# Patient Record
Sex: Female | Born: 1935 | Race: White | Hispanic: No | Smoking: Former smoker
Health system: Southern US, Community
[De-identification: ages and names within clinical notes are randomized; demographics above are authoritative.]

---

## 1963-03-05 HISTORY — PX: ABDOMINAL HYSTERECTOMY: SHX81

## 1991-03-05 HISTORY — PX: OTHER SURGICAL HISTORY: SHX169

## 2003-05-24 ENCOUNTER — Inpatient Hospital Stay (HOSPITAL_COMMUNITY): Admission: EM | Admit: 2003-05-24 | Discharge: 2003-05-26 | Payer: Self-pay | Admitting: Emergency Medicine

## 2003-06-01 ENCOUNTER — Ambulatory Visit (HOSPITAL_COMMUNITY): Admission: RE | Admit: 2003-06-01 | Discharge: 2003-06-01 | Payer: Self-pay | Admitting: Cardiology

## 2006-03-04 HISTORY — PX: CHOLECYSTECTOMY: SHX55

## 2007-10-16 ENCOUNTER — Inpatient Hospital Stay (HOSPITAL_COMMUNITY): Admission: RE | Admit: 2007-10-16 | Discharge: 2007-10-17 | Payer: Self-pay | Admitting: Neurosurgery

## 2008-09-06 ENCOUNTER — Encounter: Admission: RE | Admit: 2008-09-06 | Discharge: 2008-09-06 | Payer: Self-pay | Admitting: Neurosurgery

## 2010-01-13 IMAGING — CR DG LUMBAR SPINE 2-3V
3 series · 3 of 3 positions shown · non-contrast
Comparison: [HOSPITAL] portable lateral intraoperative
lumbar spine radiographs 10/16/2007 and [HOSPITAL] at [REDACTED] [HOSPITAL] lumbar spine MRI 09/06/2008.

CLINICAL DATA: Increasing low back and left lower extremity pain.

LUMBAR SPINE - 2-3 VIEW

[view not recorded (1 of 3)]
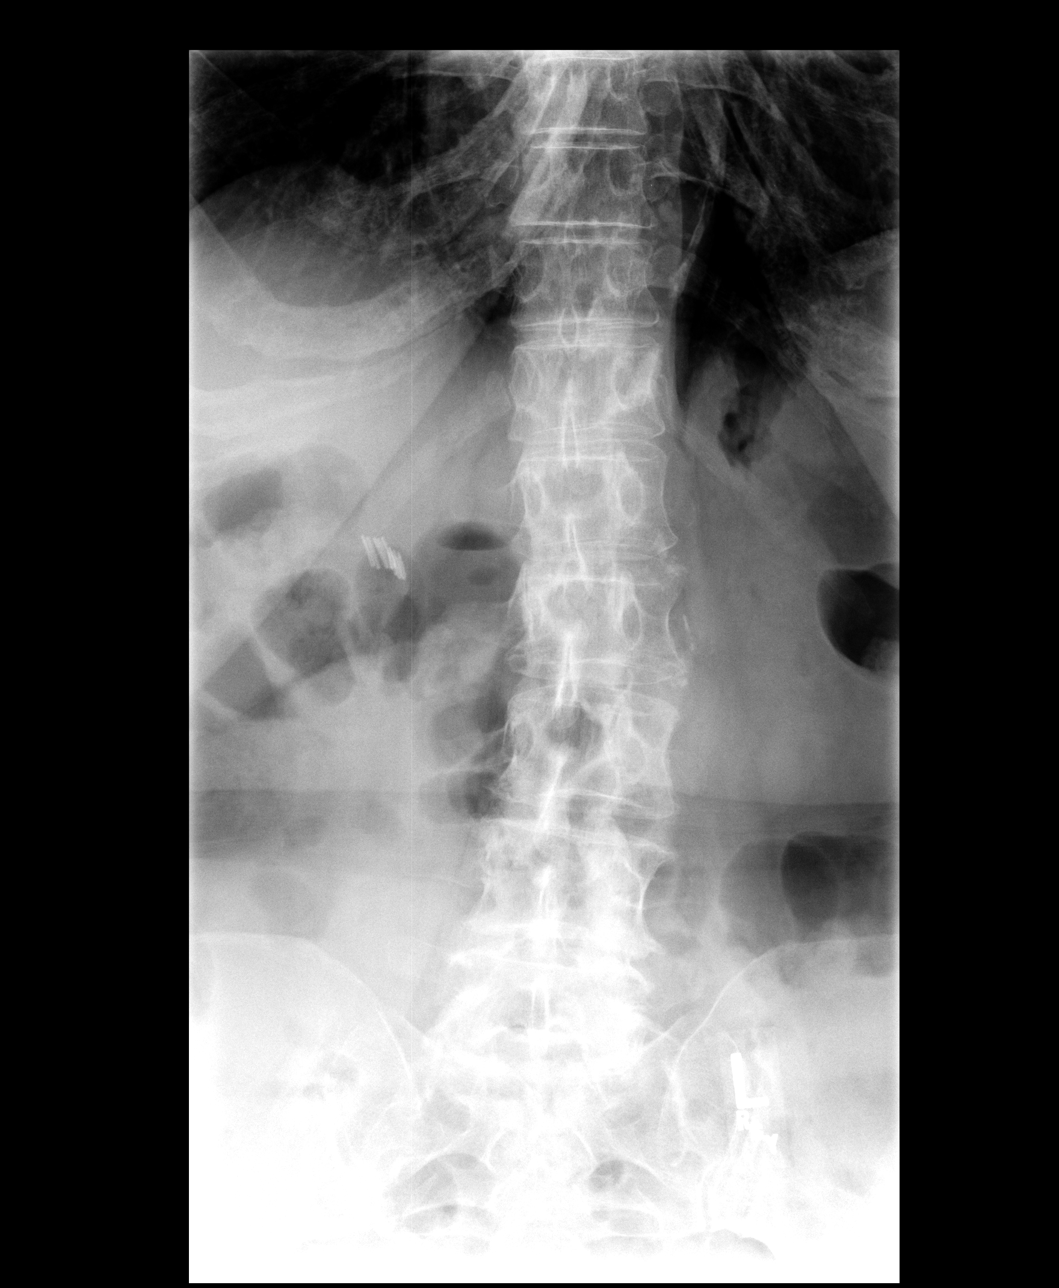

[view not recorded (2 of 3)]
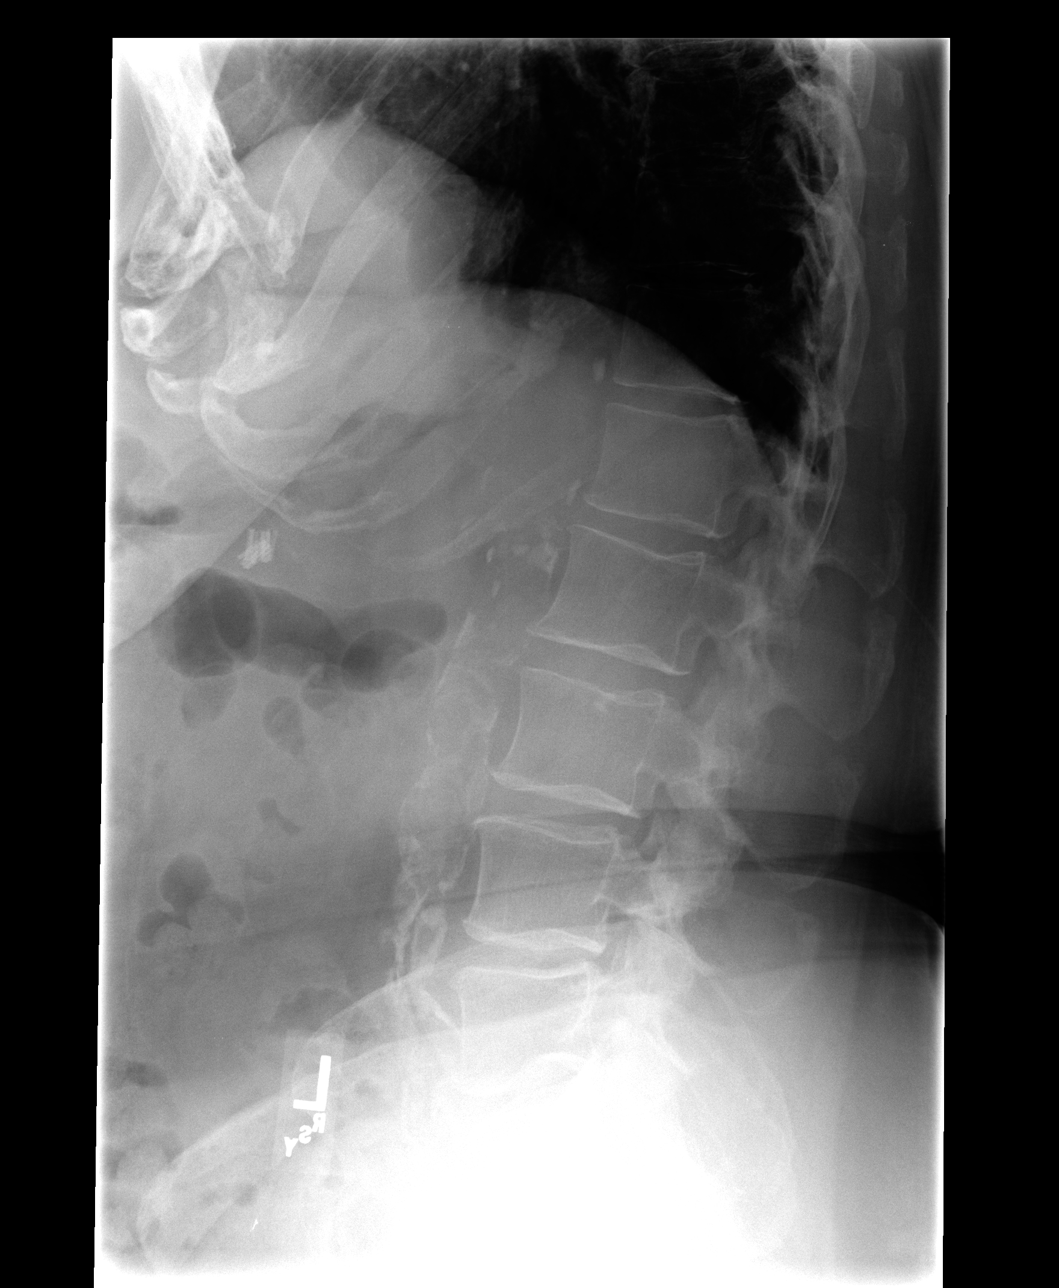

[view not recorded (3 of 3)]
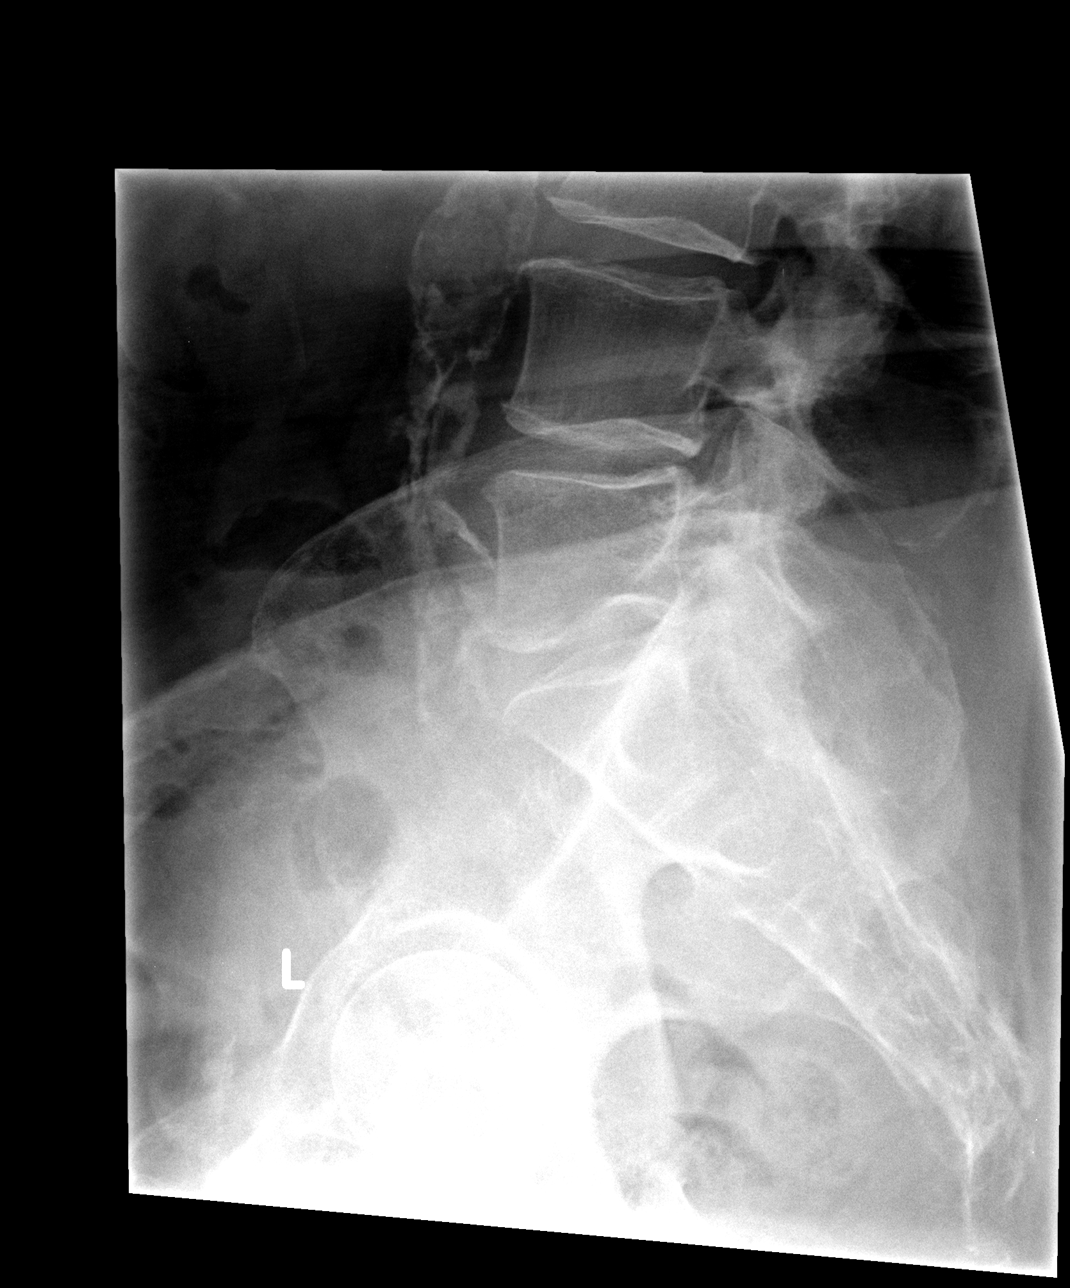

[3 of 3 positions shown; findings below may reference images not displayed]

FINDINGS: Five non-rib bearing lumbar vertebrae are seen with
slight degenerative disc space narrowing L4-5 and L5-S1.  Remaining
disc spaces and vertebral alignment normally maintained.  Slight
levo rotary scoliosis mid lumbar spine visualized.  Moderate to
marked atheromatous vascular calcification with normal caliber
abdominal aorta and postcholecystectomy right upper quadrant
surgical clips seen.  Bilateral sacroiliac joints appear normal
with no other significant osseous, articular or soft tissue
abnormalities seen.
IMPRESSION: 1.  Slight degenerative disc disease L4-5 and L5-S1 with slight
lumbar levo rotary scoliosis.
2.  Vascular calcification.
3.  No acute findings.

## 2010-03-26 ENCOUNTER — Encounter: Payer: Self-pay | Admitting: Neurosurgery

## 2010-07-17 NOTE — H&P (Signed)
NAME:  Kimberly Pearson, MUNFORD NO.:  [id number]   MEDICAL RECORD NO.:  [id number]         PATIENT TYPE:  CINP   LOCATION:                               FACILITY:  [facility]   PHYSICIAN:  Payton Doughty, M.D.      DATE OF BIRTH:  1932/01/14   DATE OF ADMISSION:  10/16/2007  DATE OF DISCHARGE:                              HISTORY & PHYSICAL   ADMISSION DIAGNOSIS:  Spondylosis at L3-L4 and L4-L5.   BODY OF TEXT:  This is a very nice 77 year old right-handed white lady  who has had back and leg pain for a couple years, but it got worse since  last fall.  Pain in her back down her left leg is worse when she is  walking and standing.  Today, it did not bother much.  I saw her with an  MRI.  She has got tight stenosis on the left side at L3-L4 and L4-L5 and  she is going to be admitted for decompression.   MEDICAL HISTORY:  Remarkable for coronary artery disease.  She had a  bypass in 1994.   MEDICATIONS:  Toprol and Diovan.  She takes Crestor.   She has no allergies.   SOCIAL HISTORY:  Does not smoke, drinks on social basis and retired.   FAMILY HISTORY:  Not given.   REVIEW OF SYSTEMS:  Marked for wearing glasses and hypertension.  Her  HEENT exam is within normal limits.  She has reasonable range of motion  of her neck.  Chest is clear.  Cardiac exam is regular rate and rhythm.  Abdomen is nontender with no hepatosplenomegaly.  Extremities are  without clubbing or cyanosis.  GU exam is deferred.  Peripheral pulses  are good.  Neurologically she is awake, alert, and oriented.  Cranial  nerves are intact.  Motor exam shows 5/5 strength throughout the upper  and lower extremities.  Sensory dysesthesia is described in the left L4-  L5 distribution.  Reflexes are 2 at the left knee, 1 at the right, 2 at  the ankles bilaterally.  Straight leg raise is negative.  MRI shows  significant stenosis at L3-L4 and L4-L5, worse at L4-L5 of the left  side.  She has a little bit of epidural  lipomatosis of L3-L4 with  bulging of the disk at L4-L5 of the left.   CLINICAL IMPRESSION:  Mixed L4-L5 radiculopathy secondary to spondylosis  and disk.   PLAN:  For laminotomy and foraminotomy on the left side at L3-L4 and L4-  L5.  We are also going to take look at the disk at L4-L5, and if that  appears it is obstructing the nerve root, we would resect the portion of  it.  The risks and benefits of this approach have been discussed with  her and she wished to proceed.    .           ______________________________  Payton Doughty, M.D.     MWR/MEDQ  D:  10/16/2007  T:  10/17/2007  Job:  [id number]

## 2010-07-17 NOTE — Op Note (Signed)
[name], Kimberly Pearson                  ACCOUNT NO.:  [id number]   MEDICAL RECORD NO.:  [id number]          PATIENT TYPE:  INP   LOCATION:  3108                         FACILITY:  [facility]   PHYSICIAN:  Payton Doughty, M.D.      DATE OF BIRTH:  01/07/1932   DATE OF PROCEDURE:  10/16/2007  DATE OF DISCHARGE:  10/17/2007                               OPERATIVE REPORT   PREOPERATIVE DIAGNOSES:  Spondylosis L3-L4, herniated disk L4-L5.   POSTOPERATIVE DIAGNOSES:  Spondylosis L3-L4, herniated disk L4-L5.   OPERATIVE PROCEDURE:  L3-L4 laminotomy, foraminotomy and L4-L5  laminectomy and diskectomy.   SURGEON:  Payton Doughty, MD   ANESTHESIA:  General endotracheal.   PREPARATION:  Prepped and draped with alcohol wipe.   COMPLICATIONS:  None.   NURSE ASSISTANT:  [place].   DOCTOR ASSISTANCE:  Coletta Memos, MD   BODY OF TEXT:  A 77 year old lady with left L4-L5 radiculopathy is taken  to the operating room, smoothly anesthetized, intubated, and placed  prone on the operating table.  Following shave, prep and drape in the  usual sterile fashion, the skin was infiltrated with 1% lidocaine with  1:400,000 epinephrine.  The skin was incised from the top of L3 to the  bottom of L4, and lamina of L3-L4 are exposed to the left side of the  subperiosteal plane.  Marker was placed and intraoperative x-ray  confirmed the correctness level.  Hemi semi-laminectomy was carried out  first on the left side of L4 to the top of ligamentum flavum that was  removed in a retrograde fashion.  Lateral aspect of the thecal sac and  lateral aspect of the L5 root were identified and the L5 root retracted  medially exposing a herniated disk that was contained under some annular  fibers.  These annular fibers were divided and the disk removed.  The  neural foramen carefully explored as well as thoracic disk and all  graspable fragments were removed.  Attention was then turned to L3-L4  disks where hemilaminectomy was  again carried out to the top of the  ligamentum flavum that was once again removed in retrograde fashion.  The thecal sac thus exposed was carefully explored and soft quadrants  and generous foraminotomy carried out over the L4 root.  Thus the L4 and  L5 roots were decompressed.  Depo-Medrol soaked pad was placed in the  laminectomy defect in successive layers of 0 Vicryl, 2-0 Vicryl, and 3-0  nylon were used to close.  Betadine and Telfa dressing applied and made  occlusive with OpSite.  The patient returned to recovery room in good  condition.          ______________________________  Payton Doughty, M.D.    MWR/MEDQ  D:  10/16/2007  T:  10/17/2007  Job:  [id number]

## 2010-07-20 NOTE — H&P (Signed)
NAME:  Kimberly Pearson, Kimberly Pearson                            ACCOUNT NO.:  [id number]   MEDICAL RECORD NO.:  [id number]                   PATIENT TYPE:  INP   LOCATION:  6529                                 FACILITY:  [facility]   PHYSICIAN:  Veneda Melter, M.D.                   DATE OF BIRTH:  [birth year]   DATE OF ADMISSION:  05/24/2003  DATE OF DISCHARGE:                                HISTORY & PHYSICAL   CHIEF COMPLAINT:  Syncope.   HISTORY:  [name] is a 77 year old white female with a known history of  coronary disease and surgical revascularization who presents with an episode  of syncope at approximately 2 p.m.  The patient was in [facility] with  her husband, who is undergoing peripheral surgical revascularization with  Dr. Arbie Cookey.  She was awakened at 3:30 a.m., and has had nothing to eat or  drink until the afternoon.  She was waiting for her daughter to come to have  lunch with her.  The patient's friends had recently gotten lunch, which was  [employer], and the aroma caused her to feel suddenly cold and clammy.  She  had some churning in her stomach, and then she had frank syncope.  There was  no witnessed seizure activity, no loss of bladder or bowel function, no  postictal symptoms.  She was out for some time, as bystanders contemplated  doing CPR.  Currently, she is in the emergency room and feels well.  She  denies any chest pain or shortness of breath.  No lightheadedness.  She  denies any recent exertional chest discomfort or dyspnea.  She has not had  any lower extremity edema.  No paroxysmal nocturnal dyspnea or orthopnea.  She denies any fevers, chills, or other constitutional symptoms.   PAST MEDICAL HISTORY:  1. Notable for inferior wall myocardial infarction complicated by     cardiogenic shock in January of 1994.  Treated with __________ and     intervention of the right coronary artery.  She had subsequently     reocclusion and underwent coronary artery bypass graft  surgery by Dr.     Tyrone Sage with placement of a vein graft to the right coronary artery, and     to he ramus intermedius.  2. Hypertension.  3. Dyslipidemia.  4. Gastrointestinal bleed with the streptokinase.   ALLERGIES:  None.   MEDICATIONS:  1. Diovan 160 mg per day.  2. Aspirin 325 daily.  3. Lopressor 25 mg b.i.d.  4. Zocor 40 mg q.h.s.   SOCIAL HISTORY:  The patient lives in [place] with her husband.  She has a  history of 2-3 packs per day up until her myocardial infarction.   FAMILY HISTORY:  Mother died at the age of 66, and had her first MI at the  age of 79.  Father died at the age of 20 with history of  coronary disease,  myocardial infarction, and hypertension.  Two sisters and one brother, all  decreased with coronary disease.  One brother underwent bypass surgery.   PHYSICAL EXAMINATION:  GENERAL:  She is an elderly white female in no acute  distress.  VITAL SIGNS:  Afebrile, pulse 82, respirations 20, blood pressure 111/62.  HEENT:  Pupils equal, round and reactive to light.  Extraocular muscles are  intact.  Oropharynx with no lesions.  NECK:  Supple.  No adenopathy, no bruits.  HEART:  Regular rhythm without murmurs.  LUNGS:  Clear to auscultation.  ABDOMEN:  Soft, nontender.  EXTREMITIES:  No edema.  Peripheral pulses are 1+ and equal bilaterally.  NEUROLOGIC:  Motor strength is 5/5.  Sensory is intact to touch.  Cranial  nerves II-XII are intact.   LABORATORY DATA:  ECG revealed normal sinus rhythm at 77, with PVC's, but no  ST-T wave changes.  White count is 9.0, hemoglobin 14.5, hematocrit 42.7,  platelets 244.  Sodium is 136, potassium 4.3, chloride 100, bicarb 32, BUN  28, glucose 96.   ASSESSMENT AND PLAN:  Ms. [name] is a 77 year old white female with acute  coronary disease who presents with syncope.  The patient has not had food or  drink in approximately 12 hours, and has been fairly active.  This may  represent hypoglycemia or dehydration.   Other causes to consider include  pulmonary embolus; however, I doubt this without significant chest  discomfort or shortness of breath.  Possible vasovagal syncope or coronary  disease with ischemia or infarction.  Finally, dysrhythmia.  She has rare  PVC's, but no significant dysrhythmia on telemetry.  Our plan at this point  would be to admit the patient to a telemetry monitor, obtaining serial ECG's  and cardiac enzymes to rule out infarction, and further cardiac work-up when  stable, such as stress imaging study, or cardiac catheterization.  As the  patient is 10 years out from her surgical revascularization, it may be  prudent to proceed directly to angiography.  Further plans will be following  review of her cardiac enzymes.                                                Veneda Melter, M.D.    Melton Alar  D:  05/24/2003  T:  05/25/2003  Job:  [id number]   cc:   Lenard Galloway (?) Dunn(?), M.D.  [place]   Arturo Morton. Riley Kill, M.D. [facility]   Dr. Debroah Baller

## 2010-07-20 NOTE — Discharge Summary (Signed)
NAME:  Kimberly, Pearson                            ACCOUNT NO.:  [id number]   MEDICAL RECORD NO.:  [id number]                   PATIENT TYPE:  INP   LOCATION:  6529                                 FACILITY:  [facility]   PHYSICIAN:  Veneda Melter, M.D.                   DATE OF BIRTH:  [birth year]   DATE OF ADMISSION:  05/24/2003  DATE OF DISCHARGE:  05/26/2003                                 DISCHARGE SUMMARY   PRIMARY CARE PHYSICIAN:  Dr. Karilyn Cota and Dr. Shawnie Pons, at [facility] at [place], [place].   PRIMARY CARDIOLOGIST:  Dr. Sherlyn Lick of [place], [facility]; also Dr. Lewayne Bunting, electrophysiologist at [facility], [place]   DISCHARGE DIAGNOSES:  1. Admitted with syncope in a setting of no food or drink for over 16 hours     and added stress of the husband's surgery that day but she felt a similar     episode prior to having a myocardial infarction in 1994.  2. Elevated D-dimer on admission through the emergency room.  Computed     tomogram of the chest negative for pulmonary embolism.  Lower extremity     ultrasound negative for deep venous thrombosis.  3. Bilateral adrenal nodules found on CT of the chest.  Followup MRI     recommended.  4. Electrophysiology consult after consideration with Dr. Ladona Ridgel who had     seen the patient and looked at the history, thinks that the mechanism of     the syncopal episode is likely neurally mediated, although     supraventricular tachycardia could be a trigger.  He recommends     continuing beta blocker therapy.   SECONDARY DIAGNOSES:  1. Severe three vessel coronary artery disease in the setting of an inferior     myocardial infarction treated with streptokinase.  She is status post     coronary artery bypass graft surgery in 1994.  At that time a saphenous     vein graft to the RCA and a saphenous vein graft to the ramus     intermediate was placed.  2. Hypertension.  3. Dyslipidemia.  4.  Very strong family history of coronary artery disease.  5. History of gastrointestinal bleed when receiving streptokinase for a     myocardial infarction in 03/1992.   PROCEDURES:  1. Computed tomogram of the chest May 25, 2003:  Negative for pulmonary     embolism.  2. A 2-D Echocardiogram May 25, 2003:  Ejection fraction 55-65%.  No     aortic stenosis.  Systolic function normal.  3. On May 25, 2003 lower extremity ultrasound:  Negative for DVT.  4. Adenosine Cardiolite study May 26, 2003:  The study shows no evidence     of ischemia, anteroapical scar, ejection fraction 61%.   DISCHARGE:  Kimberly Pearson is ready for discharge  on May 26, 2003, hospital  day number three.  She has had no evidence of dysrhythmia while on the  monitor here.  She is afebrile.  She had a negative adenosine Cardiolite  study, negative study for pulmonary embolism.  Echo shows an ejection  fraction of 55-65% with no wall motion abnormalities.  The patient has had  no repeat of her syncopal episode.  She will be followed up with an MRI.  She goes home on the following medications:   1. Diovan 160 mg daily.  2. Enteric coated aspirin 325 mg daily.  3. Lopressor 25 mg twice daily.  4. Zocor 40 mg at bedtime daily.   The discharge diet is a low sodium, low cholesterol diet.  She will have an  MRI of the abdomen Wednesday, June 01, 2003.  She will go to admitting at  [facility] at 11:30 in the morning.  She will have an office visit to follow  up with Dr. Riley Kill to discuss the MRI, which will be arranged.   BRIEF HISTORY:  Kimberly Pearson is a 77 year old female.  She has a history  of coronary artery disease discovered in a setting of presentation with  myocardial infarction.  She is status post coronary artery bypass graft  surgery by Dr. Ramon Dredge B. Gerhardt in January 1994.  Subsequent to her  surgery she follows up with her cardiologist, Dr. Sherlyn Lick, every six months.  She has had no subsequent  interventions of any kind.  She had a Cardiolite  test 3-4 years ago which the patient mentions was done just to assess the  current status.  The patient was asymptomatic and it was negative for  ischemia.  Today while waiting for her husband, who was having vascular  surgery, Mrs. Iglesias (who had arisen at 3:30 in the morning and had not eaten  or had anything to drink) was joined by friends who had recently gotten  lunch.  The aroma caused her to feel suddenly clammy.  Her stomach started  churning.  She felt as if she would faint.  She bent over and the next thing  she knew she woke up on the floor.  She had no ictal symptoms, no tonic or  clonic motions.  According to the witnesses if she had not come to soon, CPR  would have been started.  She was transferred to the emergency room for  assessment and intervention.   HOSPITAL COURSE:  After admission through the emergency room at [facility]  the patient did not exhibit any arrhythmias on the monitor for the three  days that she was here.  She had a fairly thorough workup.  She was ruled  out for myocardial infarction with a negative troponin I study x3.  A  computed tomogram of the chest was negative for PE.  Lower extremity  ultrasound was negative for DVT.  She had an echocardiogram as dictated  above and an adenosine Cardiolite which was negative for ischemia, although  it did show the presence of anteroapical scarring.  The patient was seen  also in consultation by Dr. Doylene Canning. Ladona Ridgel, who felt that the mechanism of  the syncope was likely a neurally mediated mechanism and advised her to  continue her beta blocker, which she will.  In addition, the computed  tomogram picked up bilateral adrenal nodules, most likely adenomas, but she  will have a followup MRI.   LABORATORY DATA:  Studies here, complete blood count - white cells  9, 14.5  for hemoglobin, for hematocrit 42.7, platelets 244.  Serum electrolytes - sodium 136, potassium  4.3, chloride 100, bicarbonate 32.1.  BUN is 28.  Glucose is 96.  Liver profile - cholesterol 129, triglycerides 61, HDL  cholesterol 48, LDL cholesterol 69.  Creatinine this admission was 1.1.  Troponin I study x3 at 0.01.      Maple Mirza, P.A.                    Veneda Melter, M.D.    GM/MEDQ  D:  05/26/2003  T:  05/29/2003  Job:  [id number]   cc:   Arturo Morton. Riley Kill, M.D. [facility]   Malkiat Dhatt, M.D.  [address]  [place]  [place] [address]  Fax: [phone]   Lionel December, M.D.  [address]  [place]  [place] [address]  Fax: [phone]

## 2010-07-20 NOTE — Consult Note (Signed)
NAME:  Kimberly Pearson, Kimberly Pearson                            ACCOUNT NO.:  [id number]   MEDICAL RECORD NO.:  [id number]                   PATIENT TYPE:  INP   LOCATION:  6529                                 FACILITY:  [facility]   PHYSICIAN:  Doylene Canning. Ladona Ridgel, M.D.               DATE OF BIRTH:  [birth year]   DATE OF CONSULTATION:  05/26/2003  DATE OF DISCHARGE:  05/26/2003                                   CONSULTATION   INDICATION FOR CONSULTATION:  Evaluation of syncope.   HISTORY OF PRESENT ILLNESS:  The patient is a 77 year old woman with known  coronary disease, status post diaphragmatic MI back in 1994.  She is status  post bypass surgery at that time.  She was recently in the waiting area of  the hospital, as her husband was having vascular surgery, when she developed  the sudden onset of nausea and diaphoresis, followed by dizziness.  She was  in the sitting position at that time and bent over.  The next thing she  remembers, she was awake with people around her, and she felt somewhat  weakened and diaphoretic.  She was admitted for additional evaluation.  The  patient states that in the last 30 years she has had 3-4 of these episodes.  They typically occur without particular warning, are associated with a  prodrome of nausea and diaphoresis, and result in her passing out and  feeling tired and weak after the episode.  She denies associated  palpitations.  She denies chest pain or shortness of breath.  The patient is  usually followed by Dr. Lenard Galloway __________.   PAST MEDICAL HISTORY:  As previously noted.  She has a history of  hypertension, dyslipidemia, and a history of GI bleeding when she was  treated for her MI with streptokinase.  She has a history of hysterectomy,  tonsillectomy, and is status post bypass surgery in 1994.  Her recently echo  demonstrated preserved LV systolic function.   SOCIAL HISTORY:  Notable for the patient being married.  She has a history  of tobacco abuse,  stopping smoking 10 years ago.  She denies alcohol abuse.   FAMILY HISTORY:  Notable for her mother dying at age 101 of an MI, and father  dying at age 16 of complications of coronary disease.   REVIEW OF SYSTEMS:  Notable for no fevers, chills, night sweats.  She denies  headache or vision problems.  She denies hearing problems or photophobia.  She denies skin rashes or lesions.  She denies chest pain or shortness of  breath, dyspnea, PND, or orthopnea.  She denies claudication.  She has frank  syncope, as previously noted.  She denies palpitations.  She denies  arthritic complaint.  She denies dysuria, hematuria, nocturia.  She denies  weakness, numbness, or depression.  She denies nausea, vomiting, diarrhea,  or constipation.  She denies polyuria or polydipsia.  PHYSICAL EXAMINATION:  GENERAL:  She is a pleasant, middle-aged, obese woman  in no distress.  VITAL SIGNS:  Blood pressure was 120/74, pulse 88 and regular, respirations  18.  HEENT:  Normocephalic and atraumatic.  Pupils equal and round.  Oropharynx  was moist.  Sclerae were anicteric.  NECK:  No jugular venous distention.  There was no thyromegaly.  The trachea  was midline.  CARDIOVASCULAR:  Regular rate and rhythm with normal S1 and S2.  There were  no murmurs, rubs, or gallops.  LUNGS:  Clear bilaterally to auscultation with no wheezes, rales, or  rhonchi.  ABDOMEN:  Soft, nontender, nondistended.  There was no organomegaly.  EXTREMITIES:  No clubbing, cyanosis, or edema.  Pulses were 2+ and  symmetric.   EKG demonstrates normal sinus rhythm with normal axis __________.  There  were occasional PVC's noted.  Telemetry demonstrates intermittent episodes  of a narrow QRS tachycardia, which is a long RP tachycardia at rates of 150  beats per minute.  This is consistent with an atrial tachycardia.   IMPRESSION:  1. Recurrent syncope.  2. Coronary artery disease status post myocardial infarction, status post      bypass surgery with preserved left ventricular systolic function by     echocardiogram.  3. Supraventricular tachycardia, which appears to be asymptomatic.  It     appears to be due to atrial tachycardia.  4. Hypertension.  5. Dyslipidemia.   DISCUSSION:  The patient's syncope is notable that she has not eaten all day  prior to the episode.  She became nauseated and diaphoretic and was  unresponsive for several minutes.  She has normal left ventricular function.  The patient has had several episodes of these in the past, but they were  always separated by a long period prior to the syncope.  She denies any  associated palpitations.  I agree with Cardiolite stress test.  I think the  mechanism of the syncope is likely neurally mediated in conjunction with  some orthostasis.  Her SVT could be a trigger, despite it being otherwise  asymptomatic.  For now, I would recommend continuation of beta blockade.  If  she continues to have recurrent episodes of syncope, then additional  evaluation would be warranted.                                               Doylene Canning. Ladona Ridgel, M.D.    GWT/MEDQ  D:  05/26/2003  T:  05/27/2003  Job:  [id number]   cc:   Leida Lauth(?), M.D.   Dr. Raymond Gurney

## 2010-11-30 LAB — BASIC METABOLIC PANEL
BUN: 17
Chloride: 91 — ABNORMAL LOW
Creatinine, Ser: 1.08
GFR calc non Af Amer: 50 — ABNORMAL LOW
Glucose, Bld: 102 — ABNORMAL HIGH
Potassium: 3.9

## 2010-11-30 LAB — URINALYSIS, ROUTINE W REFLEX MICROSCOPIC
Bilirubin Urine: NEGATIVE
Glucose, UA: NEGATIVE
Nitrite: NEGATIVE
Protein, ur: 30 — AB
Specific Gravity, Urine: 1.014
pH: 6

## 2010-11-30 LAB — COMPREHENSIVE METABOLIC PANEL
ALT: 26
Alkaline Phosphatase: 48
BUN: 17
Calcium: 9.6
Creatinine, Ser: 1.05
GFR calc non Af Amer: 51 — ABNORMAL LOW
Potassium: 3.9
Total Bilirubin: 0.7
Total Protein: 7.7

## 2010-11-30 LAB — CBC
Hemoglobin: 14.3
MCHC: 34
RBC: 4.06
RDW: 14.6

## 2010-11-30 LAB — DIFFERENTIAL
Eosinophils Absolute: 0.1
Lymphocytes Relative: 26
Lymphs Abs: 1.7

## 2010-11-30 LAB — URINE MICROSCOPIC-ADD ON

## 2010-11-30 LAB — POCT I-STAT 4, (NA,K, GLUC, HGB,HCT)
Glucose, Bld: 141 — ABNORMAL HIGH
Potassium: 3.9
Sodium: 130 — ABNORMAL LOW

## 2010-11-30 LAB — APTT: aPTT: 28

## 2015-01-17 DIAGNOSIS — I4510 Unspecified right bundle-branch block: Secondary | ICD-10-CM

## 2015-01-17 DIAGNOSIS — I2510 Atherosclerotic heart disease of native coronary artery without angina pectoris: Secondary | ICD-10-CM | POA: Insufficient documentation

## 2015-01-17 DIAGNOSIS — I10 Essential (primary) hypertension: Secondary | ICD-10-CM | POA: Insufficient documentation

## 2015-01-17 HISTORY — DX: Essential (primary) hypertension: I10

## 2015-01-17 HISTORY — DX: Atherosclerotic heart disease of native coronary artery without angina pectoris: I25.10

## 2015-01-17 HISTORY — DX: Unspecified right bundle-branch block: I45.10

## 2015-01-18 DIAGNOSIS — I10 Essential (primary) hypertension: Secondary | ICD-10-CM | POA: Diagnosis not present

## 2015-01-18 DIAGNOSIS — I25119 Atherosclerotic heart disease of native coronary artery with unspecified angina pectoris: Secondary | ICD-10-CM | POA: Diagnosis not present

## 2015-01-18 DIAGNOSIS — E785 Hyperlipidemia, unspecified: Secondary | ICD-10-CM | POA: Diagnosis not present

## 2015-01-18 DIAGNOSIS — I4510 Unspecified right bundle-branch block: Secondary | ICD-10-CM | POA: Diagnosis not present

## 2015-07-05 DIAGNOSIS — M545 Low back pain: Secondary | ICD-10-CM | POA: Diagnosis not present

## 2015-07-05 DIAGNOSIS — R110 Nausea: Secondary | ICD-10-CM | POA: Diagnosis not present

## 2015-07-05 DIAGNOSIS — J309 Allergic rhinitis, unspecified: Secondary | ICD-10-CM | POA: Diagnosis not present

## 2015-07-05 DIAGNOSIS — G4700 Insomnia, unspecified: Secondary | ICD-10-CM | POA: Diagnosis not present

## 2015-08-09 DIAGNOSIS — I10 Essential (primary) hypertension: Secondary | ICD-10-CM | POA: Diagnosis not present

## 2015-08-11 DIAGNOSIS — I10 Essential (primary) hypertension: Secondary | ICD-10-CM | POA: Diagnosis not present

## 2015-08-23 DIAGNOSIS — I10 Essential (primary) hypertension: Secondary | ICD-10-CM | POA: Diagnosis not present

## 2015-10-16 DIAGNOSIS — H31002 Unspecified chorioretinal scars, left eye: Secondary | ICD-10-CM | POA: Diagnosis not present

## 2015-10-16 DIAGNOSIS — H04123 Dry eye syndrome of bilateral lacrimal glands: Secondary | ICD-10-CM | POA: Diagnosis not present

## 2015-10-16 DIAGNOSIS — H524 Presbyopia: Secondary | ICD-10-CM | POA: Diagnosis not present

## 2016-02-22 DIAGNOSIS — E785 Hyperlipidemia, unspecified: Secondary | ICD-10-CM | POA: Diagnosis not present

## 2016-02-22 DIAGNOSIS — I2510 Atherosclerotic heart disease of native coronary artery without angina pectoris: Secondary | ICD-10-CM | POA: Diagnosis not present

## 2016-02-22 DIAGNOSIS — G4700 Insomnia, unspecified: Secondary | ICD-10-CM | POA: Diagnosis not present

## 2016-02-22 DIAGNOSIS — I10 Essential (primary) hypertension: Secondary | ICD-10-CM | POA: Diagnosis not present

## 2016-02-22 DIAGNOSIS — Z23 Encounter for immunization: Secondary | ICD-10-CM | POA: Diagnosis not present

## 2016-04-19 DIAGNOSIS — E785 Hyperlipidemia, unspecified: Secondary | ICD-10-CM | POA: Diagnosis not present

## 2016-04-19 DIAGNOSIS — G4700 Insomnia, unspecified: Secondary | ICD-10-CM | POA: Diagnosis not present

## 2016-04-19 DIAGNOSIS — I10 Essential (primary) hypertension: Secondary | ICD-10-CM | POA: Diagnosis not present

## 2016-04-23 DIAGNOSIS — I10 Essential (primary) hypertension: Secondary | ICD-10-CM | POA: Diagnosis not present

## 2016-04-23 DIAGNOSIS — E785 Hyperlipidemia, unspecified: Secondary | ICD-10-CM | POA: Diagnosis not present

## 2016-05-30 DIAGNOSIS — I10 Essential (primary) hypertension: Secondary | ICD-10-CM | POA: Diagnosis not present

## 2016-05-30 DIAGNOSIS — I4510 Unspecified right bundle-branch block: Secondary | ICD-10-CM | POA: Diagnosis not present

## 2016-05-30 DIAGNOSIS — I25119 Atherosclerotic heart disease of native coronary artery with unspecified angina pectoris: Secondary | ICD-10-CM | POA: Diagnosis not present

## 2016-05-30 DIAGNOSIS — E785 Hyperlipidemia, unspecified: Secondary | ICD-10-CM | POA: Diagnosis not present

## 2016-11-09 DIAGNOSIS — I959 Hypotension, unspecified: Secondary | ICD-10-CM | POA: Diagnosis not present

## 2016-11-09 DIAGNOSIS — I160 Hypertensive urgency: Secondary | ICD-10-CM | POA: Diagnosis not present

## 2016-11-09 DIAGNOSIS — R0602 Shortness of breath: Secondary | ICD-10-CM | POA: Diagnosis not present

## 2016-11-09 DIAGNOSIS — J189 Pneumonia, unspecified organism: Secondary | ICD-10-CM | POA: Diagnosis not present

## 2016-11-09 DIAGNOSIS — R918 Other nonspecific abnormal finding of lung field: Secondary | ICD-10-CM | POA: Diagnosis not present

## 2016-11-09 DIAGNOSIS — I2510 Atherosclerotic heart disease of native coronary artery without angina pectoris: Secondary | ICD-10-CM | POA: Diagnosis not present

## 2016-11-10 DIAGNOSIS — E7800 Pure hypercholesterolemia, unspecified: Secondary | ICD-10-CM | POA: Diagnosis not present

## 2016-11-10 DIAGNOSIS — K219 Gastro-esophageal reflux disease without esophagitis: Secondary | ICD-10-CM | POA: Diagnosis not present

## 2016-11-10 DIAGNOSIS — I25810 Atherosclerosis of coronary artery bypass graft(s) without angina pectoris: Secondary | ICD-10-CM | POA: Diagnosis not present

## 2016-11-10 DIAGNOSIS — Z7982 Long term (current) use of aspirin: Secondary | ICD-10-CM | POA: Diagnosis not present

## 2016-11-10 DIAGNOSIS — J189 Pneumonia, unspecified organism: Secondary | ICD-10-CM | POA: Diagnosis not present

## 2016-11-10 DIAGNOSIS — Z951 Presence of aortocoronary bypass graft: Secondary | ICD-10-CM | POA: Diagnosis not present

## 2016-11-10 DIAGNOSIS — I10 Essential (primary) hypertension: Secondary | ICD-10-CM | POA: Diagnosis not present

## 2016-11-10 DIAGNOSIS — I950 Idiopathic hypotension: Secondary | ICD-10-CM | POA: Diagnosis not present

## 2016-11-10 DIAGNOSIS — R0602 Shortness of breath: Secondary | ICD-10-CM | POA: Diagnosis not present

## 2016-11-10 DIAGNOSIS — I160 Hypertensive urgency: Secondary | ICD-10-CM | POA: Diagnosis not present

## 2016-11-10 DIAGNOSIS — R918 Other nonspecific abnormal finding of lung field: Secondary | ICD-10-CM | POA: Diagnosis not present

## 2016-11-10 DIAGNOSIS — Z79899 Other long term (current) drug therapy: Secondary | ICD-10-CM | POA: Diagnosis not present

## 2016-11-10 DIAGNOSIS — I2510 Atherosclerotic heart disease of native coronary artery without angina pectoris: Secondary | ICD-10-CM | POA: Diagnosis not present

## 2016-11-10 DIAGNOSIS — Z87891 Personal history of nicotine dependence: Secondary | ICD-10-CM | POA: Diagnosis not present

## 2016-11-10 DIAGNOSIS — I959 Hypotension, unspecified: Secondary | ICD-10-CM | POA: Diagnosis not present

## 2016-11-10 DIAGNOSIS — Z23 Encounter for immunization: Secondary | ICD-10-CM | POA: Diagnosis not present

## 2016-11-11 DIAGNOSIS — Z23 Encounter for immunization: Secondary | ICD-10-CM | POA: Diagnosis not present

## 2016-11-11 DIAGNOSIS — E7800 Pure hypercholesterolemia, unspecified: Secondary | ICD-10-CM | POA: Diagnosis not present

## 2016-11-11 DIAGNOSIS — I10 Essential (primary) hypertension: Secondary | ICD-10-CM | POA: Diagnosis not present

## 2016-11-11 DIAGNOSIS — I25810 Atherosclerosis of coronary artery bypass graft(s) without angina pectoris: Secondary | ICD-10-CM | POA: Diagnosis not present

## 2016-11-11 DIAGNOSIS — Z7982 Long term (current) use of aspirin: Secondary | ICD-10-CM | POA: Diagnosis not present

## 2016-11-11 DIAGNOSIS — I160 Hypertensive urgency: Secondary | ICD-10-CM | POA: Diagnosis not present

## 2016-11-11 DIAGNOSIS — J189 Pneumonia, unspecified organism: Secondary | ICD-10-CM | POA: Diagnosis not present

## 2016-11-11 DIAGNOSIS — I950 Idiopathic hypotension: Secondary | ICD-10-CM | POA: Diagnosis not present

## 2016-11-11 DIAGNOSIS — Z87891 Personal history of nicotine dependence: Secondary | ICD-10-CM | POA: Diagnosis not present

## 2016-11-11 DIAGNOSIS — R918 Other nonspecific abnormal finding of lung field: Secondary | ICD-10-CM | POA: Diagnosis not present

## 2016-11-11 DIAGNOSIS — Z951 Presence of aortocoronary bypass graft: Secondary | ICD-10-CM | POA: Diagnosis not present

## 2016-11-11 DIAGNOSIS — K219 Gastro-esophageal reflux disease without esophagitis: Secondary | ICD-10-CM | POA: Diagnosis not present

## 2016-11-11 DIAGNOSIS — Z79899 Other long term (current) drug therapy: Secondary | ICD-10-CM | POA: Diagnosis not present

## 2016-11-12 ENCOUNTER — Telehealth: Payer: Self-pay

## 2016-11-12 NOTE — Telephone Encounter (Signed)
Spoke with patient does not wish to schedule at this time.cn

## 2016-11-22 DIAGNOSIS — J189 Pneumonia, unspecified organism: Secondary | ICD-10-CM | POA: Diagnosis not present

## 2017-01-08 ENCOUNTER — Other Ambulatory Visit: Payer: Self-pay

## 2017-01-08 NOTE — Patient Outreach (Signed)
[facility] Care Management  01/08/2017  Kimberly RicherMary C Pearson 12/09/1931 [id number]    Medication Adherence call to Mrs. Sunday CornMary Pearson patient is showing past due under [facility]on Losartan 100 mg spoke to patient she said she has plenty of medication and does not need any at this time,she does not want to receive any more calls.  Lillia AbedAna Ollison-Moran CPhT Pharmacy Technician [facility] Care Management Direct Dial [phone]  Fax [phone] [name]

## 2017-02-27 DIAGNOSIS — J439 Emphysema, unspecified: Secondary | ICD-10-CM | POA: Diagnosis not present

## 2017-02-27 DIAGNOSIS — J189 Pneumonia, unspecified organism: Secondary | ICD-10-CM | POA: Diagnosis not present

## 2017-05-13 DIAGNOSIS — J42 Unspecified chronic bronchitis: Secondary | ICD-10-CM | POA: Diagnosis not present

## 2017-05-13 DIAGNOSIS — M150 Primary generalized (osteo)arthritis: Secondary | ICD-10-CM | POA: Diagnosis not present

## 2017-05-13 DIAGNOSIS — J310 Chronic rhinitis: Secondary | ICD-10-CM | POA: Insufficient documentation

## 2017-05-13 DIAGNOSIS — E538 Deficiency of other specified B group vitamins: Secondary | ICD-10-CM | POA: Diagnosis not present

## 2017-05-13 DIAGNOSIS — R911 Solitary pulmonary nodule: Secondary | ICD-10-CM | POA: Insufficient documentation

## 2017-05-13 DIAGNOSIS — R269 Unspecified abnormalities of gait and mobility: Secondary | ICD-10-CM

## 2017-05-13 DIAGNOSIS — N1831 Chronic kidney disease, stage 3a: Secondary | ICD-10-CM

## 2017-05-13 DIAGNOSIS — R609 Edema, unspecified: Secondary | ICD-10-CM | POA: Insufficient documentation

## 2017-05-13 DIAGNOSIS — I25718 Atherosclerosis of autologous vein coronary artery bypass graft(s) with other forms of angina pectoris: Secondary | ICD-10-CM | POA: Diagnosis not present

## 2017-05-13 DIAGNOSIS — K219 Gastro-esophageal reflux disease without esophagitis: Secondary | ICD-10-CM

## 2017-05-13 DIAGNOSIS — M159 Polyosteoarthritis, unspecified: Secondary | ICD-10-CM | POA: Insufficient documentation

## 2017-05-13 DIAGNOSIS — R5383 Other fatigue: Secondary | ICD-10-CM | POA: Insufficient documentation

## 2017-05-13 DIAGNOSIS — E782 Mixed hyperlipidemia: Secondary | ICD-10-CM

## 2017-05-13 HISTORY — DX: Gastro-esophageal reflux disease without esophagitis: K21.9

## 2017-05-13 HISTORY — DX: Unspecified abnormalities of gait and mobility: R26.9

## 2017-05-13 HISTORY — DX: Deficiency of other specified B group vitamins: E53.8

## 2017-05-13 HISTORY — DX: Polyosteoarthritis, unspecified: M15.9

## 2017-05-13 HISTORY — DX: Mixed hyperlipidemia: E78.2

## 2017-05-13 HISTORY — DX: Chronic kidney disease, stage 3a: N18.31

## 2017-05-13 HISTORY — DX: Unspecified chronic bronchitis: J42

## 2017-05-13 HISTORY — DX: Chronic rhinitis: J31.0

## 2017-05-13 HISTORY — DX: Solitary pulmonary nodule: R91.1

## 2017-05-14 DIAGNOSIS — D649 Anemia, unspecified: Secondary | ICD-10-CM | POA: Diagnosis not present

## 2017-05-14 DIAGNOSIS — N183 Chronic kidney disease, stage 3 (moderate): Secondary | ICD-10-CM | POA: Diagnosis not present

## 2017-05-14 DIAGNOSIS — E538 Deficiency of other specified B group vitamins: Secondary | ICD-10-CM | POA: Diagnosis not present

## 2017-05-14 DIAGNOSIS — E782 Mixed hyperlipidemia: Secondary | ICD-10-CM | POA: Diagnosis not present

## 2017-06-05 DIAGNOSIS — I4510 Unspecified right bundle-branch block: Secondary | ICD-10-CM | POA: Diagnosis not present

## 2017-06-05 DIAGNOSIS — E782 Mixed hyperlipidemia: Secondary | ICD-10-CM | POA: Diagnosis not present

## 2017-06-05 DIAGNOSIS — Z951 Presence of aortocoronary bypass graft: Secondary | ICD-10-CM | POA: Insufficient documentation

## 2017-06-05 DIAGNOSIS — I2510 Atherosclerotic heart disease of native coronary artery without angina pectoris: Secondary | ICD-10-CM | POA: Diagnosis not present

## 2017-06-05 DIAGNOSIS — I252 Old myocardial infarction: Secondary | ICD-10-CM

## 2017-06-05 DIAGNOSIS — I10 Essential (primary) hypertension: Secondary | ICD-10-CM | POA: Diagnosis not present

## 2017-06-05 HISTORY — DX: Old myocardial infarction: I25.2

## 2017-06-09 DIAGNOSIS — N183 Chronic kidney disease, stage 3 (moderate): Secondary | ICD-10-CM | POA: Diagnosis not present

## 2017-06-09 DIAGNOSIS — I2510 Atherosclerotic heart disease of native coronary artery without angina pectoris: Secondary | ICD-10-CM | POA: Diagnosis not present

## 2017-06-09 DIAGNOSIS — E782 Mixed hyperlipidemia: Secondary | ICD-10-CM | POA: Diagnosis not present

## 2017-06-09 DIAGNOSIS — I129 Hypertensive chronic kidney disease with stage 1 through stage 4 chronic kidney disease, or unspecified chronic kidney disease: Secondary | ICD-10-CM | POA: Diagnosis not present

## 2017-07-15 DIAGNOSIS — R300 Dysuria: Secondary | ICD-10-CM | POA: Diagnosis not present

## 2017-07-15 DIAGNOSIS — I129 Hypertensive chronic kidney disease with stage 1 through stage 4 chronic kidney disease, or unspecified chronic kidney disease: Secondary | ICD-10-CM | POA: Diagnosis not present

## 2017-07-15 DIAGNOSIS — I2510 Atherosclerotic heart disease of native coronary artery without angina pectoris: Secondary | ICD-10-CM | POA: Diagnosis not present

## 2017-07-15 DIAGNOSIS — N183 Chronic kidney disease, stage 3 (moderate): Secondary | ICD-10-CM | POA: Diagnosis not present

## 2017-07-16 DIAGNOSIS — R300 Dysuria: Secondary | ICD-10-CM | POA: Diagnosis not present

## 2017-08-12 DIAGNOSIS — G4700 Insomnia, unspecified: Secondary | ICD-10-CM | POA: Diagnosis not present

## 2017-08-12 DIAGNOSIS — N183 Chronic kidney disease, stage 3 (moderate): Secondary | ICD-10-CM | POA: Diagnosis not present

## 2017-08-12 DIAGNOSIS — I129 Hypertensive chronic kidney disease with stage 1 through stage 4 chronic kidney disease, or unspecified chronic kidney disease: Secondary | ICD-10-CM | POA: Diagnosis not present

## 2017-08-12 DIAGNOSIS — I2510 Atherosclerotic heart disease of native coronary artery without angina pectoris: Secondary | ICD-10-CM | POA: Diagnosis not present

## 2017-08-12 DIAGNOSIS — K219 Gastro-esophageal reflux disease without esophagitis: Secondary | ICD-10-CM | POA: Diagnosis not present

## 2018-12-28 DIAGNOSIS — N390 Urinary tract infection, site not specified: Secondary | ICD-10-CM | POA: Insufficient documentation

## 2018-12-28 HISTORY — DX: Urinary tract infection, site not specified: N39.0

## 2019-06-28 DIAGNOSIS — K5909 Other constipation: Secondary | ICD-10-CM | POA: Insufficient documentation

## 2019-06-28 DIAGNOSIS — R7303 Prediabetes: Secondary | ICD-10-CM | POA: Insufficient documentation

## 2019-06-28 HISTORY — DX: Other constipation: K59.09

## 2019-06-28 HISTORY — DX: Prediabetes: R73.03

## 2019-07-26 DIAGNOSIS — D72829 Elevated white blood cell count, unspecified: Secondary | ICD-10-CM

## 2019-07-26 DIAGNOSIS — N9489 Other specified conditions associated with female genital organs and menstrual cycle: Secondary | ICD-10-CM

## 2019-07-26 DIAGNOSIS — R531 Weakness: Secondary | ICD-10-CM

## 2019-07-26 DIAGNOSIS — M6282 Rhabdomyolysis: Secondary | ICD-10-CM

## 2019-07-27 DIAGNOSIS — R531 Weakness: Secondary | ICD-10-CM | POA: Diagnosis not present

## 2019-07-27 DIAGNOSIS — D72829 Elevated white blood cell count, unspecified: Secondary | ICD-10-CM | POA: Diagnosis not present

## 2019-07-27 DIAGNOSIS — M6282 Rhabdomyolysis: Secondary | ICD-10-CM | POA: Diagnosis not present

## 2019-07-27 DIAGNOSIS — N9489 Other specified conditions associated with female genital organs and menstrual cycle: Secondary | ICD-10-CM | POA: Diagnosis not present

## 2019-07-28 DIAGNOSIS — M6282 Rhabdomyolysis: Secondary | ICD-10-CM | POA: Diagnosis not present

## 2019-07-28 DIAGNOSIS — R531 Weakness: Secondary | ICD-10-CM | POA: Diagnosis not present

## 2019-07-28 DIAGNOSIS — N9489 Other specified conditions associated with female genital organs and menstrual cycle: Secondary | ICD-10-CM | POA: Diagnosis not present

## 2019-07-28 DIAGNOSIS — D72829 Elevated white blood cell count, unspecified: Secondary | ICD-10-CM | POA: Diagnosis not present

## 2019-08-19 DIAGNOSIS — F03918 Unspecified dementia, unspecified severity, with other behavioral disturbance: Secondary | ICD-10-CM | POA: Insufficient documentation

## 2019-08-19 HISTORY — DX: Unspecified dementia, unspecified severity, with other behavioral disturbance: F03.918

## 2019-10-02 DIAGNOSIS — R55 Syncope and collapse: Secondary | ICD-10-CM

## 2019-11-03 DIAGNOSIS — I444 Left anterior fascicular block: Secondary | ICD-10-CM

## 2019-11-03 DIAGNOSIS — Z87898 Personal history of other specified conditions: Secondary | ICD-10-CM | POA: Insufficient documentation

## 2019-11-03 HISTORY — DX: Left anterior fascicular block: I44.4

## 2020-03-13 DIAGNOSIS — L89311 Pressure ulcer of right buttock, stage 1: Secondary | ICD-10-CM

## 2020-03-13 HISTORY — DX: Pressure ulcer of right buttock, stage 1: L89.311

## 2020-04-20 DIAGNOSIS — Z8701 Personal history of pneumonia (recurrent): Secondary | ICD-10-CM

## 2020-04-20 HISTORY — DX: Personal history of pneumonia (recurrent): Z87.01

## 2020-10-23 DIAGNOSIS — D638 Anemia in other chronic diseases classified elsewhere: Secondary | ICD-10-CM | POA: Insufficient documentation

## 2020-12-24 DIAGNOSIS — I4510 Unspecified right bundle-branch block: Secondary | ICD-10-CM

## 2020-12-27 DIAGNOSIS — R1900 Intra-abdominal and pelvic swelling, mass and lump, unspecified site: Secondary | ICD-10-CM | POA: Insufficient documentation

## 2020-12-27 HISTORY — DX: Intra-abdominal and pelvic swelling, mass and lump, unspecified site: R19.00

## 2021-06-13 ENCOUNTER — Ambulatory Visit (INDEPENDENT_AMBULATORY_CARE_PROVIDER_SITE_OTHER): Payer: Medicare Other | Admitting: Legal Medicine

## 2021-06-13 ENCOUNTER — Encounter: Payer: Self-pay | Admitting: Legal Medicine

## 2021-06-13 VITALS — BP 140/70 | HR 98 | Temp 97.6°F | Resp 15 | Ht 61.0 in | Wt 124.0 lb

## 2021-06-13 DIAGNOSIS — G301 Alzheimer's disease with late onset: Secondary | ICD-10-CM

## 2021-06-13 DIAGNOSIS — F02C18 Dementia in other diseases classified elsewhere, severe, with other behavioral disturbance: Secondary | ICD-10-CM

## 2021-06-13 DIAGNOSIS — N390 Urinary tract infection, site not specified: Secondary | ICD-10-CM

## 2021-06-13 DIAGNOSIS — J410 Simple chronic bronchitis: Secondary | ICD-10-CM

## 2021-06-13 DIAGNOSIS — F0280 Dementia in other diseases classified elsewhere without behavioral disturbance: Secondary | ICD-10-CM | POA: Insufficient documentation

## 2021-06-13 LAB — POCT URINALYSIS DIP (CLINITEK)
Bilirubin, UA: NEGATIVE
Blood, UA: NEGATIVE
Glucose, UA: NEGATIVE mg/dL
Ketones, POC UA: NEGATIVE mg/dL
Nitrite, UA: NEGATIVE
POC PROTEIN,UA: 30 — AB
Spec Grav, UA: 1.015 (ref 1.010–1.025)
Urobilinogen, UA: 0.2 E.U./dL
pH, UA: 6 (ref 5.0–8.0)

## 2021-06-13 MED ORDER — NITROFURANTOIN MONOHYD MACRO 100 MG PO CAPS: 100.0000 mg | ORAL_CAPSULE | Freq: Two times a day (BID) | ORAL | 0 refills | Status: DC

## 2021-06-13 MED ORDER — ALBUTEROL SULFATE HFA 108 (90 BASE) MCG/ACT IN AERS: 2.0000 | INHALATION_SPRAY | Freq: Four times a day (QID) | RESPIRATORY_TRACT | 2 refills | Status: DC | PRN

## 2021-06-13 NOTE — Progress Notes (Addendum)
? ?New Patient Office Visit ? ?Subjective:  ?Patient ID: Kimberly Pearson, female    DOB: Feb 23, 1932  Age: 88 y.o. MRN: [id number] ? ?CC:  ?Chief Complaint  ?Patient presents with  ? New Patient (Initial Visit)  ? Urinary Tract Infection  ? ? ?HPI ?[name] presents for establish a new PCP.  ?She is coming today for possibly UTI. Her daughter noticed a strong odor and she looks confuses. She also mentioned some soreness on her abdomen. She has poor hearing.  She is acting worse.  She had one UTI last year.   ? ?She has DAT.moderately severe, lives with daughter. ? ?Past Medical History:  ?Diagnosis Date  ? Atherosclerotic heart disease of native coronary artery without angina pectoris 01/17/2015  ? Formatting of this note might be different from the original. with old MI (in Jan 1. 1994), complicated by shock and CHB. S/P Streptokinase followed by PTCA with reocc. needing emergency CABGS of RCA and ramus intermedius; Last Impression: Jun 16 2013  Pharmacologic MPS Jan 2013 without ischemia, LV function and EF% normal  MUNLEY, BRIAN J ([facility]). Formatting of this note might be diffe  ? Chronic bronchitis (HCC) 05/13/2017  ? Chronic constipation 06/28/2019  ? Chronic rhinitis 05/13/2017  ? Dementia with behavioral disturbance (HCC) 08/19/2019  ? Essential hypertension 01/17/2015  ? Gait abnormality 05/13/2017  ? GERD without esophagitis 05/13/2017  ? History of aspiration pneumonia 04/20/2020  ? LAFB (left anterior fascicular block) 11/03/2019  ? Mixed hyperlipidemia 05/13/2017  ? Old MI (myocardial infarction) 06/05/2017  ? Pelvic mass 12/27/2020  ? Formatting of this note might be different from the original. Noted 07/2019 on CT 5-6cm.  Saw GYN. Referred to GYN Onc. Did not see them. Chose conservative observation.  CT 12/2020 shows stable size.  ? Prediabetes 06/28/2019  ? Pressure injury of right buttock, stage 1 03/13/2020  ? Primary osteoarthritis involving multiple joints 05/13/2017  ? Pulmonary nodule 05/13/2017  ?  Recurrent UTI 12/28/2018  ? Stage 3a chronic kidney disease (HCC) 05/13/2017  ? Unspecified right bundle-branch block 01/17/2015  ? Vitamin B12 deficiency 05/13/2017  ? ? ?Past Surgical History:  ?Procedure Laterality Date  ? ABDOMINAL HYSTERECTOMY  1965  ? CHOLECYSTECTOMY  2008  ? open heart surgery  1993  ? Triple bypass surgery/ballon angioplasty  ? ? ?Family History  ?Problem Relation Age of Onset  ? Hyperlipidemia Mother   ? Heart disease Mother   ? Hyperlipidemia Father   ? Heart disease Father   ? ? ?Social History  ? ?Socioeconomic History  ? Marital status: Widowed  ?  Spouse name: Not on file  ? Number of children: 2  ? Years of education: Not on file  ? Highest education level: Not on file  ?Occupational History  ? Occupation: Retired  ?Tobacco Use  ? Smoking status: Former  ?  Packs/day: 1.00  ?  Years: 30.00  ?  Pack years: 30.00  ?  Types: Cigarettes  ?  Quit date: 10  ?  Years since quitting: 30.2  ? Smokeless tobacco: Never  ?Substance and Sexual Activity  ? Alcohol use: Not Currently  ? Drug use: Never  ? Sexual activity: Not Currently  ?Other Topics Concern  ? Not on file  ?Social History Narrative  ? Not on file  ? ?Social Determinants of Health  ? ?Financial Resource Strain: Not on file  ?Food Insecurity: Not on file  ?Transportation Needs: Not on file  ?Physical Activity: Not on file  ?  Stress: Not on file  ?Social Connections: Not on file  ?Intimate Partner Violence: Not on file  ? ? ?ROS ?Review of Systems  ?Constitutional:  Negative for chills, fatigue and fever.  ?HENT:  Negative for congestion, ear pain and sore throat.   ?Respiratory:  Negative for cough and shortness of breath.   ?Cardiovascular:  Negative for chest pain and palpitations.  ?Gastrointestinal:  Positive for abdominal pain. Negative for constipation, diarrhea, nausea and vomiting.  ?Endocrine: Negative for polydipsia, polyphagia and polyuria.  ?Genitourinary:  Negative for difficulty urinating and dysuria.  ?Musculoskeletal:   Negative for arthralgias, back pain and myalgias.  ?Skin:  Negative for rash.  ?Neurological:  Negative for headaches.  ?     Dementia is worse and confusion.  ?Psychiatric/Behavioral:  Negative for dysphoric mood. The patient is not nervous/anxious.   ? ?Objective:  ? ?Today's Vitals: BP 140/70   Pulse 98   Temp 97.6 ?F (36.4 ?C)   Resp 15   Ht  (1.549 m)   Wt 124 lb (56.2 kg)   LMP  (LMP Unknown)   SpO2 98%   BMI 23.43 kg/m?  ? ?Physical Exam ?Vitals reviewed.  ?Constitutional:   ?   General: She is not in acute distress. ?   Appearance: Normal appearance.  ?HENT:  ?   Head: Normocephalic.  ?   Right Ear: Tympanic membrane normal.  ?   Left Ear: Tympanic membrane normal.  ?   Mouth/Throat:  ?   Mouth: Mucous membranes are moist.  ?   Pharynx: Oropharynx is clear.  ?Eyes:  ?   Extraocular Movements: Extraocular movements intact.  ?   Conjunctiva/sclera: Conjunctivae normal.  ?   Pupils: Pupils are equal, round, and reactive to light.  ?Cardiovascular:  ?   Rate and Rhythm: Normal rate and regular rhythm.  ?   Pulses: Normal pulses.  ?   Heart sounds: Normal heart sounds. No murmur heard. ?  No gallop.  ?Pulmonary:  ?   Effort: Pulmonary effort is normal. No respiratory distress.  ?   Breath sounds: Normal breath sounds. No wheezing.  ?Abdominal:  ?   General: Abdomen is flat. Bowel sounds are normal. There is no distension.  ?   Palpations: Abdomen is soft.  ?   Tenderness: There is no abdominal tenderness.  ?Musculoskeletal:  ?   Cervical back: Normal range of motion and neck supple.  ?Skin: ?   General: Skin is warm.  ?   Capillary Refill: Capillary refill takes less than 2 seconds.  ?Neurological:  ?   General: No focal deficit present.  ?   Mental Status: She is alert.  ?   Comments: Aphasic, cannot hear  ? ? ?Assessment & Plan:  ? ?Problem List Items Addressed This Visit   ?Diagnoses and all orders for this visit: ?Recurrent UTI ?-     POCT URINALYSIS DIP (CLINITEK) ?-     Urine Culture ?-      nitrofurantoin, macrocrystal-monohydrate, (MACROBID) 100 MG capsule; Take 1 capsule (100 mg total) by mouth 2 (two) times daily. ?Patient has dementia of the Alzheimer's type for greater than 5 years but is acting more quiet and having more withdrawal symptoms for the last week.  She is found to have UTI which will be treated. ?Simple chronic bronchitis (HCC) ?-     albuterol (VENTOLIN HFA) 108 (90 Base) MCG/ACT inhaler; Inhale 2 puffs into the lungs every 6 (six) hours as needed for wheezing or shortness of  breath. ?Patient does have COPD and uses Ventolin as necessary I do not find a pulmonary work-up on her past records ?Severe late onset Alzheimer's dementia with other behavioral disturbance (HCC) ?Patient has what appears to be late onset Alzheimer's type dementia with no history of strokes or other disturbances that could cause this.  She is basically aphasic and apparently has very bad hearing and unable to hear [place].  She is unable to follow simple commands. ?I spoke with the caregiver at length who is starting to tire out being the only person to take care of her 24/7 I recommended some day at times or her adult daycare options to give her time to perform things that she needs as well as to give her some time off.  There is no one else in the family other than her husband that is much help.  We discussed about the needs to understand what is actually going on in an Alzheimer's patient as well as patient's and distraction if there are troubling behaviors.  Patient is not incontinent which helps. ?  ?  ? Respiratory  ? ?Outpatient Encounter Medications as of 06/13/2021  ?Medication Sig  ? aspirin 81 MG EC tablet Take 1 tablet by mouth daily.  ? atorvastatin (LIPITOR) 80 MG tablet Take 80 mg by mouth daily.  ? cyanocobalamin 1000 MCG tablet Take 1 tablet by mouth daily.  ? furosemide (LASIX) 20 MG tablet Take 20 mg by mouth daily as needed.  ? losartan (COZAAR) 50 MG tablet Take 50 mg by mouth daily.  ?  montelukast (SINGULAIR) 10 MG tablet Take 10 mg by mouth at bedtime.  ? nitrofurantoin, macrocrystal-monohydrate, (MACROBID) 100 MG capsule Take 1 capsule (100 mg total) by mouth 2 (two) times daily.  ? ondansetron (ZOFRAN)

## 2021-06-18 LAB — URINE CULTURE

## 2021-06-18 NOTE — Progress Notes (Signed)
Urine culture E. Coli, and klebsiella, both sensitive to nitrofuradantoin ?lp

## 2021-07-20 ENCOUNTER — Telehealth: Payer: Self-pay

## 2021-07-20 NOTE — Telephone Encounter (Signed)
Called patient and spoke to daughter Kelvin Cellar re: need for annual wellness visit- daughter was unable to commit to scheduling an appointment this week  for her mom and requests a call to schedule pm appointment after the week of May 22.   Susette Racer, RN, [facility]

## 2022-12-03 DEATH — deceased
